# Patient Record
Sex: Male | Born: 1980 | Race: White | Hispanic: No | Marital: Married | Smoking: Never smoker
Health system: Southern US, Community
[De-identification: ages and names within clinical notes are randomized; demographics above are authoritative.]

## PROBLEM LIST (undated history)

## (undated) DIAGNOSIS — R5383 Other fatigue: Secondary | ICD-10-CM

## (undated) DIAGNOSIS — J342 Deviated nasal septum: Secondary | ICD-10-CM

## (undated) DIAGNOSIS — R002 Palpitations: Secondary | ICD-10-CM

## (undated) HISTORY — DX: Deviated nasal septum: J34.2

## (undated) HISTORY — DX: Other fatigue: R53.83

## (undated) HISTORY — DX: Palpitations: R00.2

## (undated) HISTORY — PX: TONSILLECTOMY: SUR1361

## (undated) HISTORY — PX: CLOSED REDUCTION / MANIPULATION JOINT: SUR207

---

## 2011-03-21 ENCOUNTER — Ambulatory Visit (HOSPITAL_COMMUNITY)
Admission: RE | Admit: 2011-03-21 | Discharge: 2011-03-21 | Disposition: A | Discharge status: Home / Self Care | Payer: Self-pay | Source: Ambulatory Visit | Attending: Chiropractic Medicine | Admitting: Chiropractic Medicine

## 2011-03-21 ENCOUNTER — Other Ambulatory Visit (HOSPITAL_COMMUNITY): Payer: Self-pay | Admitting: Chiropractic Medicine

## 2011-03-21 DIAGNOSIS — R52 Pain, unspecified: Secondary | ICD-10-CM

## 2011-03-21 DIAGNOSIS — M542 Cervicalgia: Secondary | ICD-10-CM | POA: Insufficient documentation

## 2011-03-21 DIAGNOSIS — M546 Pain in thoracic spine: Secondary | ICD-10-CM | POA: Insufficient documentation

## 2015-10-11 ENCOUNTER — Other Ambulatory Visit: Payer: Self-pay | Admitting: Otolaryngology

## 2015-10-11 NOTE — H&P (Signed)
PREOPERATIVE H&P  Chief Complaint: nasal obstruction and snoring  HPI: Mark Guerrero is a 34 y.o. male who presents for evaluation of nasal obstruction and snoring. He always has trouble breathing through his nose at night even when he uses flonase. He also snores at night according to his wife but doesn't stop breathing. Even when his nose is clearer he still snores some but not as bad. He has sepal deviation and turbinate hypertrophy. He's taken to the OR for septoplasty and TR.  No past medical history on file. No past surgical history on file. Social History   Social History  . Marital Status: Single    Spouse Name: N/A  . Number of Children: N/A  . Years of Education: N/A   Social History Main Topics  . Smoking status: Not on file  . Smokeless tobacco: Not on file  . Alcohol Use: Not on file  . Drug Use: Not on file  . Sexual Activity: Not on file   Other Topics Concern  . Not on file   Social History Narrative  . No narrative on file   No family history on file. Allergies not on file Prior to Admission medications   Not on File     Positive ROS: per hpi  All other systems have been reviewed and were otherwise negative with the exception of those mentioned in the HPI and as above.  Physical Exam: There were no vitals filed for this visit.  General: Alert, no acute distress Oral: Normal oral mucosa and s/p tonsillectomy as a child. Long thick uvula Nasal: Septal deviation to the left with large turbiates Neck: No palpable adenopathy or thyroid nodules Ear: Ear canal is clear with normal appearing TMs Cardiovascular: Regular rate and rhythm, no murmur.  Respiratory: Clear to auscultation Neurologic: Alert and oriented x 3   Assessment/Plan: SEPTAL DEVIATION, TURBINATE HYPERTROPHY Plan for Procedure(s): BILATERAL NASAL SEPTOPLASTY WITH TURBINATE REDUCTION   Dillard CannonNEWMAN, Nyeli Holtmeyer, MD 10/11/2015 3:46 PM

## 2015-10-12 ENCOUNTER — Encounter (HOSPITAL_BASED_OUTPATIENT_CLINIC_OR_DEPARTMENT_OTHER): Payer: Self-pay | Admitting: *Deleted

## 2015-10-18 ENCOUNTER — Ambulatory Visit (HOSPITAL_BASED_OUTPATIENT_CLINIC_OR_DEPARTMENT_OTHER): Admission: RE | Admit: 2015-10-18 | Payer: BLUE CROSS/BLUE SHIELD | Source: Ambulatory Visit | Admitting: Otolaryngology

## 2015-10-18 SURGERY — SEPTOPLASTY, NOSE, WITH NASAL TURBINATE REDUCTION
Anesthesia: General | Laterality: Bilateral

## 2018-10-01 ENCOUNTER — Ambulatory Visit: Payer: BLUE CROSS/BLUE SHIELD | Admitting: Family Medicine

## 2018-10-02 ENCOUNTER — Encounter (HOSPITAL_COMMUNITY): Payer: Self-pay | Admitting: Emergency Medicine

## 2018-10-02 ENCOUNTER — Emergency Department (HOSPITAL_COMMUNITY): Payer: 59

## 2018-10-02 ENCOUNTER — Emergency Department (HOSPITAL_COMMUNITY)
Admission: EM | Admit: 2018-10-02 | Discharge: 2018-10-03 | Disposition: A | Discharge status: Home / Self Care | Payer: 59 | Attending: Emergency Medicine | Admitting: Emergency Medicine

## 2018-10-02 ENCOUNTER — Other Ambulatory Visit: Payer: Self-pay

## 2018-10-02 DIAGNOSIS — Z79899 Other long term (current) drug therapy: Secondary | ICD-10-CM | POA: Insufficient documentation

## 2018-10-02 DIAGNOSIS — R5383 Other fatigue: Secondary | ICD-10-CM | POA: Diagnosis not present

## 2018-10-02 DIAGNOSIS — R531 Weakness: Secondary | ICD-10-CM | POA: Diagnosis present

## 2018-10-02 DIAGNOSIS — Z91010 Allergy to peanuts: Secondary | ICD-10-CM | POA: Diagnosis not present

## 2018-10-02 LAB — I-STAT TROPONIN, ED: Troponin i, poc: 0 ng/mL (ref 0.00–0.08)

## 2018-10-02 LAB — BASIC METABOLIC PANEL
ANION GAP: 9 (ref 5–15)
BUN: 12 mg/dL (ref 6–20)
CHLORIDE: 109 mmol/L (ref 98–111)
CO2: 23 mmol/L (ref 22–32)
Calcium: 9.5 mg/dL (ref 8.9–10.3)
Creatinine, Ser: 0.85 mg/dL (ref 0.61–1.24)
GFR calc Af Amer: >60 mL/min (ref >60)
GLUCOSE: 89 mg/dL (ref 70–99)
Potassium: 3.8 mmol/L (ref 3.5–5.1)
Sodium: 141 mmol/L (ref 135–145)

## 2018-10-02 LAB — CBC
HEMATOCRIT: 46.6 % (ref 39.0–52.0)
Hemoglobin: 15.6 g/dL (ref 13.0–17.0)
MCH: 30.3 pg (ref 26.0–34.0)
MCHC: 33.5 g/dL (ref 30.0–36.0)
MCV: 90.5 fL (ref 80.0–100.0)
NRBC: 0 % (ref 0.0–0.2)
Platelets: 252 10*3/uL (ref 150–400)
RBC: 5.15 MIL/uL (ref 4.22–5.81)
RDW: 11.5 % (ref 11.5–15.5)
WBC: 7.5 10*3/uL (ref 4.0–10.5)

## 2018-10-02 LAB — MAGNESIUM: MAGNESIUM: 2.2 mg/dL (ref 1.7–2.4)

## 2018-10-02 NOTE — ED Notes (Signed)
Patient transported to X-ray 

## 2018-10-02 NOTE — ED Triage Notes (Signed)
C/O of fatigue, SOB. Pt seen at urgent care 3 hours ago. Per pt, EKG showed irregular heart beat and was sent to ER.

## 2018-10-03 LAB — URINALYSIS, ROUTINE W REFLEX MICROSCOPIC
BILIRUBIN URINE: NEGATIVE
Glucose, UA: NEGATIVE mg/dL
Hgb urine dipstick: NEGATIVE
KETONES UR: NEGATIVE mg/dL
LEUKOCYTES UA: NEGATIVE
NITRITE: NEGATIVE
PROTEIN: NEGATIVE mg/dL
Specific Gravity, Urine: 1.005 (ref 1.005–1.030)
pH: 5 (ref 5.0–8.0)

## 2018-10-03 LAB — TSH: TSH: 4.016 u[IU]/mL (ref 0.350–4.500)

## 2018-10-03 NOTE — ED Notes (Signed)
Patient verbalizes understanding of discharge instructions. Opportunity for questioning and answers were provided. Armband removed by staff, pt discharged from ED ambulatory.   

## 2018-10-03 NOTE — ED Provider Notes (Signed)
[facility] Provider Note   CSN: [id number] Arrival date & time: 10/02/18  2244     History   Chief Complaint Chief Complaint  Patient presents with  . Weakness    HPI Mark Maxwellyler Tartt is a 37 y.o. male.  HPI 37 year old male with no significant past medical history here with fatigue.  The patient states that he has been under significantly increased stress recently at work.  He runs a small business.  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  He also has some occasional fullness sensation as well as a feeling of intermittent palpitations when he gets home.  He is exhausted by 9:00.  This is somewhat atypical for him.  Of note, his family did recently have a cold, with cough, but he has not been coughing.  He has tried sleeping and took a brief vacation this weekend, without any improvement.  He went to urgent care and was reportedly told he had calcifications on his chest x-ray and was told to come to the ER.  He subsequent presents for evaluation.  Denies any current chest pain or shortness of breath.  No personal family history of heart disease.  No history of thyroid disease.  No over-the-counter medications.  Denies any drug use.  No weight loss or night sweats.  History reviewed. No pertinent past medical history.  There are no active problems to display for this patient.   Past Surgical History:  Procedure Laterality Date  . CLOSED REDUCTION / MANIPULATION JOINT    . TONSILLECTOMY          Home Medications    Prior to Admission medications   Medication Sig Start Date End Date Taking? Authorizing Provider  fexofenadine (ALLEGRA) 30 MG tablet Take 30 mg by mouth 2 (two) times daily.    [provider]  fluticasone (FLONASE) 50 MCG/ACT nasal spray Place into both nostrils daily.    [provider]    Family  History History reviewed. No pertinent family history.  Social History Social History   Tobacco Use  . Smoking status: Never Smoker  . Smokeless tobacco: Never Used  Substance Use Topics  . Alcohol use: Yes    Comment: occas  . Drug use: No     Allergies   Peanut-containing drug products   Review of Systems Review of Systems  Constitutional: Positive for fatigue. Negative for chills and fever.  HENT: Negative for congestion and rhinorrhea.   Eyes: Negative for visual disturbance.  Respiratory: Negative for cough, shortness of breath and wheezing.   Cardiovascular: Positive for palpitations. Negative for chest pain and leg swelling.  Gastrointestinal: Negative for abdominal pain, diarrhea, nausea and vomiting.  Genitourinary: Negative for dysuria and flank pain.  Musculoskeletal: Negative for neck pain and neck stiffness.  Skin: Negative for rash and wound.  Allergic/Immunologic: Negative for immunocompromised state.  Neurological: Positive for weakness. Negative for syncope and headaches.  All other systems reviewed and are negative.    Physical Exam Updated Vital Signs BP 128/88 (BP Location: Right Arm)   Pulse 60   Temp 97.6 F (36.4 C) (Oral)   Resp 16   Ht 5\' 8"  (1.727 m)   Wt 67.1 kg   SpO2 100%   BMI 22.50 kg/m   Physical Exam  Constitutional: He is oriented to person, place, and time. He appears well-developed and well-nourished.  No distress.  HENT:  Head: Normocephalic and atraumatic.  Mouth/Throat: Oropharynx is clear and moist.  Eyes: Conjunctivae are normal.  Neck: Neck supple.  Cardiovascular: Normal rate, regular rhythm and normal heart sounds. Exam reveals no friction rub.  No murmur heard. Pulmonary/Chest: Effort normal and breath sounds normal. No respiratory distress. He has no wheezes. He has no rales.  Abdominal: He exhibits no distension.  Musculoskeletal: He exhibits no edema.  Neurological: He is alert and oriented to person, place,  and time. He exhibits normal muscle tone.  Skin: Skin is warm. Capillary refill takes less than 2 seconds.  Psychiatric: He has a normal mood and affect.  Nursing note and vitals reviewed.    ED Treatments / Results  Labs (all labs ordered are listed, but only abnormal results are displayed) Labs Reviewed  URINALYSIS, ROUTINE W REFLEX MICROSCOPIC - Abnormal; Notable for the following components:      Result Value   Color, Urine STRAW (*)    All other components within normal limits  BASIC METABOLIC PANEL  CBC  TSH  MAGNESIUM  I-STAT TROPONIN, ED    EKG EKG Interpretation  Date/Time:  Thursday October 02 2018 22:53:11 EST Ventricular Rate:  62 PR Interval:    QRS Duration: 101 QT Interval:  375 QTC Calculation: 381 R Axis:   56 Text Interpretation:  Sinus rhythm Low voltage, precordial leads Probable anteroseptal infarct, old No old tracing to compare Confirmed by Shaune Pollack [phone]) on 10/02/2018 11:07:09 PM   Radiology Dg Chest 2 View  Result Date: 10/02/2018 CLINICAL DATA:  Irregular heartbeat.  Shortness of breath. EXAM: CHEST - 2 VIEW COMPARISON:  None. FINDINGS: The heart size and mediastinal contours are within normal limits. Both lungs are clear. The visualized skeletal structures are unremarkable. IMPRESSION: No active cardiopulmonary disease. Electronically Signed   By: Gerome Sam III M.D   On: 10/02/2018 23:52    Procedures Procedures (including critical care time)  Medications Ordered in ED Medications - No data to display   Initial Impression / Assessment and Plan / ED Course  I have reviewed the triage vital signs and the nursing notes.  Pertinent labs & imaging results that were available during my care of the patient were reviewed by me and considered in my medical decision making (see chart for details).     37 yo M here with subjective weakness. Labs today are very reassuring. No anemia, lyte abnormality. His EKG shows sinus  bradycardia and I suspect this is physiological. I reviewed his old FitBit data and his resting HR is normally 50s. CXR here is normal. Doubt any significant underlying emergent pathology. TSH normal. Suspect this could be work-related fatigue. However,he does report h/o intermittent palpitations, with some SOB with it so will refer for possible outpt holter. No signs of WPW, long QT or other abnormality on EKG. D/c home.  Final Clinical Impressions(s) / ED Diagnoses   Final diagnoses:  Weakness  Fatigue, unspecified type    ED Discharge Orders    None       Shaune Pollack, MD 10/03/18 0110

## 2018-10-03 NOTE — ED Notes (Signed)
Stands on own ability for ortho VS. No light-headed feelings. Steady on feet.

## 2018-10-03 NOTE — Discharge Instructions (Addendum)
If you notice your heart rate consistently dropping below 45-50 with symptoms of lightheadedness, and this persists for more than 5 minutes, return to the ER  Drink plenty of fluids  Try to get at least 8 hours of sleep nightly  Avoid caffeine intake later in the evening  Follow-up with Cardiologist as above

## 2018-10-15 NOTE — Progress Notes (Signed)
Cardiology Office Note   Date:  10/17/2018   ID:  Mark Guerrero, DOB [birth year], MRN [id number]  PCP:  Norval Gable, DO  Cardiologist:   No primary care provider on file. Referring:  Shaune Pollack, MD  Chief Complaint  Patient presents with  . Palpitations      History of Present Illness: Mark Guerrero is a 37 y.o. male who presents for palpitations and weakness.  He was I the ED late last month for this.  There were no significant findings.  He did have a low HR on his FitBit but no other significant abnormalities.  I reviewed these records for this visit.   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  He has stress with this.  He says he wakes up is not rested.  He has a deviated septum and was told that he snores.  He was describing some symptoms of feeling like his heart was racing but it really was not.  He wears a Fitbit and he would notice that his heart would go up but it would go very low as well particularly when he was asleep.  Because his heart rate was in the 40s he was sent to the emergency room.  I reviewed these records for this visit.  There were no significant abnormalities.  He is quite active.  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  But he still does occasionally and it hurts his back but it does not cause chest pain, chest pressure, neck or arm discomfort.  Even when he feels like he has some rapid heart rate is not describing skipped heartbeat and his heart rate on his Fitbit may be normal and he feels like he might be high.  He wonders if some of this could be anxiety.  He is never been managed or treated for this.   Past Medical History:  Diagnosis Date  . Deviated septum   . Fatigue   . Palpitation     Past Surgical History:  Procedure Laterality Date  . CLOSED REDUCTION / MANIPULATION JOINT    . TONSILLECTOMY       Current Outpatient Medications  Medication  Sig Dispense Refill  . fexofenadine (ALLEGRA) 30 MG tablet Take 30 mg by mouth 2 (two) times daily.    . fluticasone (FLONASE) 50 MCG/ACT nasal spray Place into both nostrils daily.     No current facility-administered medications for this visit.     Allergies:   Peanut-containing drug products    Social History:  The patient  reports that he has never smoked. He has never used smokeless tobacco. He reports that he drinks alcohol. He reports that he does not use drugs.   Family History:  The patient's family history includes Hyperlipidemia in his father and mother; Hypertension in his father and mother.    ROS:  Please see the history of present illness.   Otherwise, review of systems are positive for none.   All other systems are reviewed and negative.    PHYSICAL EXAM: VS:  BP 121/78 (BP Location: Left Arm, Patient Position: Sitting, Cuff Size: Normal)   Pulse 72   Ht 5\' 8"  (1.727 m)   Wt 147 lb (66.7 kg)   BMI 22.35 kg/m  , BMI Body mass index is 22.35 kg/m. GENERAL:  Well appearing HEENT:  Pupils equal round and reactive, fundi not visualized, oral  mucosa unremarkable NECK:  No jugular venous distention, waveform within normal limits, carotid upstroke brisk and symmetric, no bruits, no thyromegaly LYMPHATICS:  No cervical, inguinal adenopathy LUNGS:  Clear to auscultation bilaterally BACK:  No CVA tenderness CHEST:  Unremarkable HEART:  PMI not displaced or sustained,S1 and S2 within normal limits, no S3, no S4, no clicks, no rubs, no murmurs ABD:  Flat, positive bowel sounds normal in frequency in pitch, no bruits, no rebound, no guarding, no midline pulsatile mass, no hepatomegaly, no splenomegaly EXT:  2 plus pulses throughout, no edema, no cyanosis no clubbing SKIN:  No rashes no nodules NEURO:  Cranial nerves II through XII grossly intact, motor grossly intact throughout PSYCH:  Cognitively intact, oriented to person place and time    EKG:  EKG is not ordered  today. The ekg ordered today demonstrates sinus rhythm, rate 62, axis within normal limits, intervals within normal limits, no acute ST-T wave changes.   Recent Labs: 10/02/2018: BUN 12; Creatinine, Ser 0.85; Hemoglobin 15.6; Magnesium 2.2; Platelets 252; Potassium 3.8; Sodium 141; TSH 4.016    Lipid Panel No results found for: CHOL, TRIG, HDL, CHOLHDL, VLDL, LDLCALC, LDLDIRECT    Wt Readings from Last 3 Encounters:  10/17/18 147 lb (66.7 kg)  10/02/18 148 lb (67.1 kg)      Other studies Reviewed: Additional studies/ records that were reviewed today include: ED records. Review of the above records demonstrates:  Please see elsewhere in the note.     ASSESSMENT AND PLAN:  PALPITATIONS: I think his tachycardia sensation is not actually tachycardia.  He had bradycardia but probably because of his increased level of fitness.  His nighttime bradycardia would not be an issue.  I reviewed his Fitbit and he had normal heart rate response with activity.  No further evaluation is warranted.  Of note I did review his blood work and his TSH was normal.  Electrolytes were unremarkable.  WEAKNESS: This might be related to get more sleep.  He has a deviated septum and is been seeing ENT I think is reasonable to continue that route or to consider sleep apnea which would more likely be central since he does not have an obstructive type physiology.  He will look into this and follow-up with his primary provider and his ENT.  CHEST PAIN: I think the pretest probability of obstructive coronary disease is extremely low.  I think this may well be stress related.  We talked a long time about this.  I would advise exercise.  We talked about relaxation and perspective.  No further cardiac work-up is suggested.    Current medicines are reviewed at length with the patient today.  The patient does not have concerns regarding medicines.  The following changes have been made:  no change  Labs/ tests ordered  today include: None No orders of the defined types were placed in this encounter.    Disposition:   FU with me as needed.      Signed, Rollene RotundaJames Xeng Kucher, MD  10/17/2018 10:19 AM    [facility]

## 2018-10-17 ENCOUNTER — Ambulatory Visit: Payer: 59 | Admitting: Cardiology

## 2018-10-17 ENCOUNTER — Encounter: Payer: Self-pay | Admitting: Cardiology

## 2018-10-17 VITALS — BP 121/78 | HR 72 | Ht 68.0 in | Wt 147.0 lb

## 2018-10-17 DIAGNOSIS — R079 Chest pain, unspecified: Secondary | ICD-10-CM | POA: Diagnosis not present

## 2018-10-17 DIAGNOSIS — R002 Palpitations: Secondary | ICD-10-CM | POA: Diagnosis not present

## 2018-10-17 NOTE — Patient Instructions (Signed)

## 2018-10-27 NOTE — Progress Notes (Signed)
[facility] [address] [place], [place] [address] Phone: [phone] Subjective:    I Ronelle Nigh am serving as a Neurosurgeon for Dr. Antoine Primas.    CC: Back pain follow-up  GLO:[id number]  Aceson Pitzer is a 37 y.o. male coming in with complaint of back pain. 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 Has xrays on his phone. Posture is an issue. No numbness and tingling.    Location- Chronic Character- sharp Aggravating factors- sitting for too long Reliving factors- Ice, heat Therapies tried-  Severity-intensity 10   Patient has x-rays from 2012.  X-ray showed the patient did have anterior wedging of the thoracic spine in 3 different levels.  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  Past Medical History:  Diagnosis Date  . Deviated septum   . Fatigue   . Palpitation    Past Surgical History:  Procedure Laterality Date  . CLOSED REDUCTION / MANIPULATION JOINT    . TONSILLECTOMY     Social History   Socioeconomic History  . Marital status: Married    Spouse name: Not on file  . Number of children: Not on file  . Years of education: Not on file  . Highest education level: Not on file  Occupational History  . Not on file  Social Needs  . Financial resource strain: Not on file  . Food insecurity:    Worry: Not on file    Inability: Not on file  . Transportation needs:    Medical: Not on file    Non-medical: Not on file  Tobacco Use  . Smoking status: Never Smoker  . Smokeless tobacco: Never Used  Substance and Sexual Activity  . Alcohol use: Yes    Comment: occas  . Drug use: No  . Sexual activity: Not on file    Comment: MARRIED  Lifestyle  . Physical activity:    Days per week: Not on file    Minutes per session: Not on file  . Stress: Not on file  Relationships  . Social connections:    Talks on phone: Not on file    Gets together: Not on file    Attends religious service: Not on file   Active member of club or organization: Not on file    Attends meetings of clubs or organizations: Not on file    Relationship status: Not on file  Other Topics Concern  . Not on file  Social History Narrative  . Not on file   Allergies  Allergen Reactions  . Peanut-Containing Drug Products    Family History  Problem Relation Age of Onset  . Hyperlipidemia Mother   . Hypertension Mother   . Hyperlipidemia Father   . Hypertension Father       Current Outpatient Medications (Respiratory):  .  fexofenadine (ALLEGRA) 30 MG tablet, Take 30 mg by mouth 2 (two) times daily. .  fluticasone (FLONASE) 50 MCG/ACT nasal spray, Place into both nostrils daily.       Past medical history, social, surgical and family history all reviewed in electronic medical record.  No pertanent information unless stated regarding to the chief complaint.   Review of Systems:  No headache, visual changes, nausea, vomiting, diarrhea, constipation, dizziness, abdominal pain, skin rash, fevers, chills, night sweats, weight loss, swollen lymph nodes, body aches, joint swelling, muscle aches, chest pain, shortness of breath, mood changes.   Objective  There were no vitals taken for this visit. Systems examined below  as of    General: No apparent distress alert and oriented x3 mood and affect normal, dressed appropriately.  HEENT: Pupils equal, extraocular movements intact  Respiratory: Patient's speak in full sentences and does not appear short of breath  Cardiovascular: No lower extremity edema, non tender, no erythema  Skin: Warm dry intact with no signs of infection or rash on extremities or on axial skeleton.  Abdomen: Soft nontender  Neuro: Cranial nerves II through XII are intact, neurovascularly intact in all extremities with 2+ DTRs and 2+ pulses.  Lymph: No lymphadenopathy of posterior or anterior cervical chain or axillae bilaterally.  Gait normal with good balance and coordination.  MSK:  Non  tender with full range of motion and good stability and symmetric strength and tone of shoulders, elbows, wrist, hip, knee and ankles bilaterally.  Back Exam:  Inspection: Unremarkable  Motion: Flexion 45 deg, Extension 45 deg, Side Bending to 45 deg bilaterally,  Rotation to 45 deg bilaterally  SLR laying: Negative  XSLR laying: Negative  Palpable tenderness: Tender to palpation in the paraspinal musculature mostly in the thoracolumbar juncture. FABER: negative. Sensory change: Gross sensation intact to all lumbar and sacral dermatomes.  Reflexes: 2+ at both patellar tendons, 2+ at achilles tendons, Babinski's downgoing.  Strength at foot  Plantar-flexion: 5/5 Dorsi-flexion: 5/5 Eversion: 5/5 Inversion: 5/5  Leg strength  Quad: 5/5 Hamstring: 5/5 Hip flexor: 5/5 Hip abductors: 5/5  Gait unremarkable.  [id number]; 15 additional minutes spent for Therapeutic exercises as stated in above notes.  This included exercises focusing on stretching, strengthening, with significant focus on eccentric aspects.   Long term goals include an improvement in range of motion, strength, endurance as well as avoiding reinjury. Patient's frequency would include in 1-2 times a day, 3-5 times a week for a duration of 6-12 weeks. Low back exercises that included:  Pelvic tilt/bracing instruction to focus on control of the pelvic girdle and lower abdominal muscles  Glute strengthening exercises, focusing on proper firing of the glutes without engaging the low back muscles Proper stretching techniques for maximum relief for the hamstrings, hip flexors, low back and some rotation where tolerated   Proper technique shown and discussed handout in great detail with ATC.  All questions were discussed and answered.     Impression and Recommendations:     This case required medical decision making of moderate complexity. The above documentation has been reviewed and is accurate and complete Judi SaaZachary M Sharmaine Bain, DO        Note: This dictation was prepared with Dragon dictation along with smaller phrase technology. Any transcriptional errors that result from this process are unintentional.

## 2018-10-28 ENCOUNTER — Ambulatory Visit (INDEPENDENT_AMBULATORY_CARE_PROVIDER_SITE_OTHER)
Admission: RE | Admit: 2018-10-28 | Discharge: 2018-10-28 | Disposition: A | Discharge status: Home / Self Care | Payer: 59 | Source: Ambulatory Visit | Attending: Family Medicine | Admitting: Family Medicine

## 2018-10-28 ENCOUNTER — Ambulatory Visit: Payer: 59 | Admitting: Family Medicine

## 2018-10-28 ENCOUNTER — Encounter: Payer: Self-pay | Admitting: Family Medicine

## 2018-10-28 VITALS — BP 130/74 | HR 70 | Ht 68.0 in | Wt 149.0 lb

## 2018-10-28 DIAGNOSIS — M546 Pain in thoracic spine: Secondary | ICD-10-CM | POA: Diagnosis not present

## 2018-10-28 DIAGNOSIS — G8929 Other chronic pain: Secondary | ICD-10-CM

## 2018-10-28 DIAGNOSIS — M549 Dorsalgia, unspecified: Secondary | ICD-10-CM

## 2018-10-28 MED ORDER — VITAMIN D (ERGOCALCIFEROL) 1.25 MG (50000 UNIT) PO CAPS: 50000.0000 [IU] | ORAL_CAPSULE | ORAL | 0 refills | Status: AC

## 2018-10-28 NOTE — Assessment & Plan Note (Signed)
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  Concern with patient previous imaging for the possibility of Scheuermann's disease.  Neck could be possibly compression fractures however never followed up.  Patient has tried chiropractic care for some time but did not feel that would be beneficial until we know more.  Possible laboratory work-up will be considered at follow-up if continuing to have trouble.  Started once weekly vitamin D.  Home exercises given and work with Event organiserathletic trainer.  Discussed icing regimen.  Discussed over-the-counter medications.  Follow-up again 4 to 6 weeks

## 2018-10-28 NOTE — Patient Instructions (Signed)
Good to see you.  Ice 20 minutes 2 times daily. Usually after activity and before bed. Exercises 3 times a week.  Adjustable standing desk would be great  Continue your other therapies we discussed Read a little of scheurman's disease. Once weekly vitamin D for 12 weeks Over the counter get  Turmeric 500mg  daily  Tart cherry extract any dos eat night See me again in in 5-7 weeks Happy holidays!

## 2018-12-08 NOTE — Progress Notes (Signed)
[facility] [address] [place], [place] [address] Phone: [phone] Subjective:   [name], am serving as a scribe for Dr. Antoine Primas.   CC: Back pain follow-up  HCW:[id number]  Mark Guerrero is a 38 y.o. male coming in with complaint of back pain. Does have less pain since last visit. Has had one session of Rolfing since last visit. Has not been using standing desk as he rarely sits down at work.  Did have x-rays at last follow-up.  Very minimal Schmorl nodes noted.  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  Feels like he is doing approximately 50% better.  Taking the vitamin supplementations that were recommended     Past Medical History:  Diagnosis Date  . Deviated septum   . Fatigue   . Palpitation    Past Surgical History:  Procedure Laterality Date  . CLOSED REDUCTION / MANIPULATION JOINT    . TONSILLECTOMY     Social History   Socioeconomic History  . Marital status: Married    Spouse name: Not on file  . Number of children: Not on file  . Years of education: Not on file  . Highest education level: Not on file  Occupational History  . Not on file  Social Needs  . Financial resource strain: Not on file  . Food insecurity:    Worry: Not on file    Inability: Not on file  . Transportation needs:    Medical: Not on file    Non-medical: Not on file  Tobacco Use  . Smoking status: Never Smoker  . Smokeless tobacco: Never Used  Substance and Sexual Activity  . Alcohol use: Yes    Comment: occas  . Drug use: No  . Sexual activity: Not on file    Comment: MARRIED  Lifestyle  . Physical activity:    Days per week: Not on file    Minutes per session: Not on file  . Stress: Not on file  Relationships  . Social connections:    Talks on phone: Not on file    Gets together: Not on file    Attends religious service: Not on file    Active member of club or organization: Not on file    Attends meetings  of clubs or organizations: Not on file    Relationship status: Not on file  Other Topics Concern  . Not on file  Social History Narrative  . Not on file   Allergies  Allergen Reactions  . Peanut-Containing Drug Products    Family History  Problem Relation Age of Onset  . Hyperlipidemia Mother   . Hypertension Mother   . Hyperlipidemia Father   . Hypertension Father       Current Outpatient Medications (Respiratory):  .  fexofenadine (ALLEGRA) 30 MG tablet, Take 30 mg by mouth 2 (two) times daily. .  fluticasone (FLONASE) 50 MCG/ACT nasal spray, Place into both nostrils daily.    Current Outpatient Medications (Other):  Marland Kitchen  Vitamin D, Ergocalciferol, (DRISDOL) 1.25 MG (50000 UT) CAPS capsule, Take 1 capsule (50,000 Units total) by mouth every 7 (seven) days. Marland Kitchen  amoxicillin-clavulanate (AUGMENTIN) 875-125 MG tablet, Take 1 tablet by mouth 2 (two) times daily for 10 days.    Past medical history, social, surgical and family history all reviewed in electronic medical record.  No pertanent information unless stated regarding to the chief complaint.   Review of Systems:  No headache, visual  changes, nausea, vomiting, diarrhea, constipation, dizziness, abdominal pain, skin rash, fevers, chills, night sweats, weight loss, swollen lymph nodes, body aches, joint swelling, muscle aches, chest pain, shortness of breath, mood changes.   Objective  Blood pressure 108/76, pulse 85, height 5\' 8"  (1.727 m), weight 144 lb (65.3 kg), SpO2 98 %.    General: No apparent distress alert and oriented x3 mood and affect normal, dressed appropriately.  HEENT: Pupils equal, extraocular movements intact  Respiratory: Patient's speak in full sentences and does not appear short of breath  Cardiovascular: No lower extremity edema, non tender, no erythema  Skin: Warm dry intact with no signs of infection or rash on extremities or on axial skeleton.  Abdomen: Soft nontender  Neuro: Cranial nerves II  through XII are intact, neurovascularly intact in all extremities with 2+ DTRs and 2+ pulses.  Lymph: No lymphadenopathy of posterior or anterior cervical chain or axillae bilaterally.  Gait normal with good balance and coordination.  MSK:  Non tender with full range of motion and good stability and symmetric strength and tone of shoulders, elbows, wrist, hip, knee and ankles bilaterally.  Back Exam:  Inspection: Very mild increase in kyphosis of the upper thoracic but otherwise fairly unremarkable Motion: Flexion 45 deg, Extension 45 deg, Side Bending to 45 deg bilaterally,  Rotation to 45 deg bilaterally  SLR laying: Negative  XSLR laying: Negative  Palpable tenderness: Tightness still noted in the thoracolumbar junction right greater than left.Marland Kitchen FABER: negative. Sensory change: Gross sensation intact to all lumbar and sacral dermatomes.  Reflexes: 2+ at both patellar tendons, 2+ at achilles tendons, Babinski's downgoing.  Strength at foot  Plantar-flexion: 5/5 Dorsi-flexion: 5/5 Eversion: 5/5 Inversion: 5/5  Leg strength  Quad: 5/5 Hamstring: 5/5 Hip flexor: 5/5 Hip abductors: 5/5  Gait unremarkable.   Impression and Recommendations:    . The above documentation has been reviewed and is accurate and complete [name], DO       Note: This dictation was prepared with Dragon dictation along with smaller phrase technology. Any transcriptional errors that result from this process are unintentional.

## 2018-12-09 ENCOUNTER — Ambulatory Visit: Payer: 59 | Admitting: Family Medicine

## 2018-12-09 ENCOUNTER — Encounter: Payer: Self-pay | Admitting: Family Medicine

## 2018-12-09 DIAGNOSIS — G8929 Other chronic pain: Secondary | ICD-10-CM

## 2018-12-09 DIAGNOSIS — M546 Pain in thoracic spine: Secondary | ICD-10-CM | POA: Diagnosis not present

## 2018-12-09 MED ORDER — AMOXICILLIN-POT CLAVULANATE 875-125 MG PO TABS: 1.0000 | ORAL_TABLET | Freq: Two times a day (BID) | ORAL | 0 refills | Status: AC

## 2018-12-09 NOTE — Assessment & Plan Note (Signed)
Patient's x-ray showed some mild Schmorl nodes.  Patient is made of 50 to 60% improvement with just with conservative therapy.  Encourage patient to increase activity slowly over the course of next several weeks.  Discussed icing regimen and home exercise.  Discussed the possibility of formal physical therapy.  As long as patient is well follow-up again in 2 to 3 months.

## 2018-12-09 NOTE — Patient Instructions (Signed)
Good to see you  Ice is your friend Try to make it a routine and stay active Keep working on the core strength  Augmentin 2 times a day for 10 days and start in 2 days if not better See me again in 2 months

## 2019-07-12 IMAGING — DX DG LUMBAR SPINE COMPLETE 4+V
5 series · 5 of 5 positions shown · non-contrast
Comparison: None.

CLINICAL DATA: 37-year-old male with chronic mid and lower back
pain for 3-5 years. No known injury. Initial encounter.

EXAM:
LUMBAR SPINE - COMPLETE 4+ VIEW

[l-spine ap]
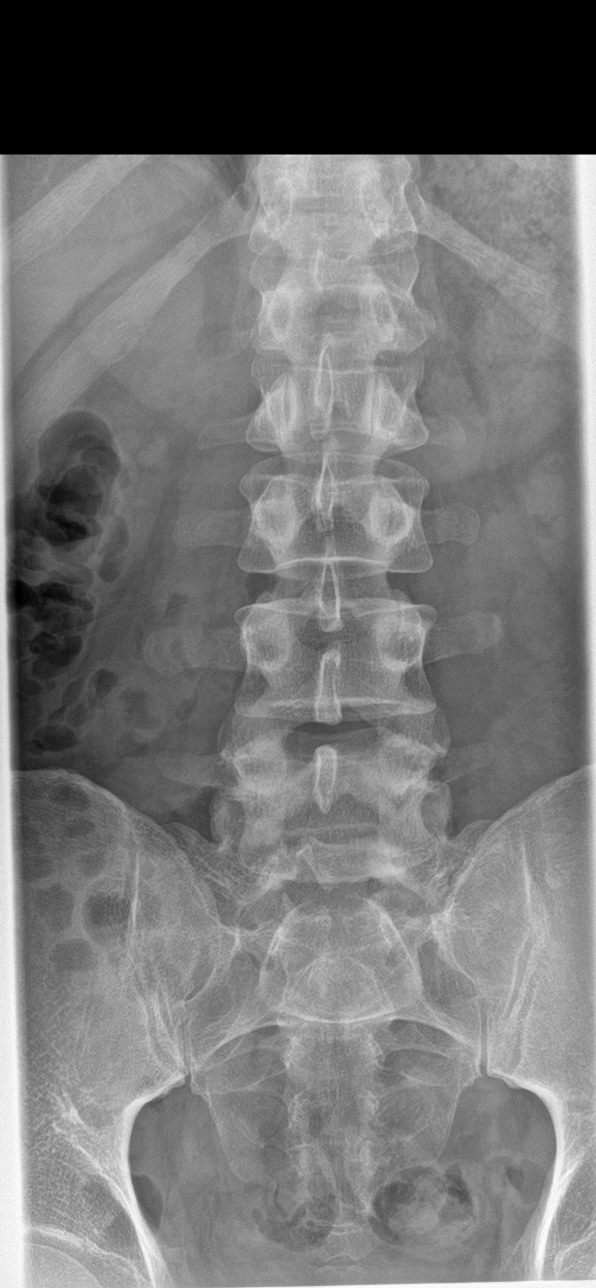

[l-spine obl (1 of 2)]
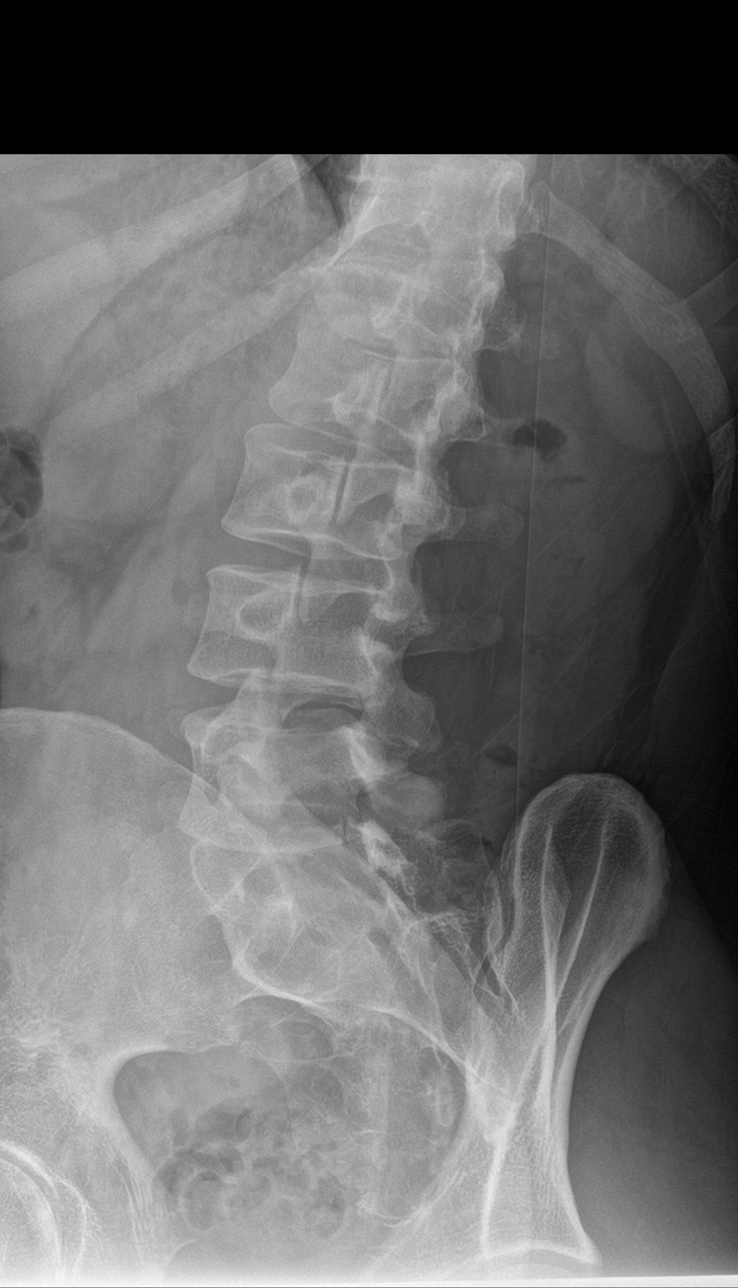

[l-spine obl (2 of 2)]
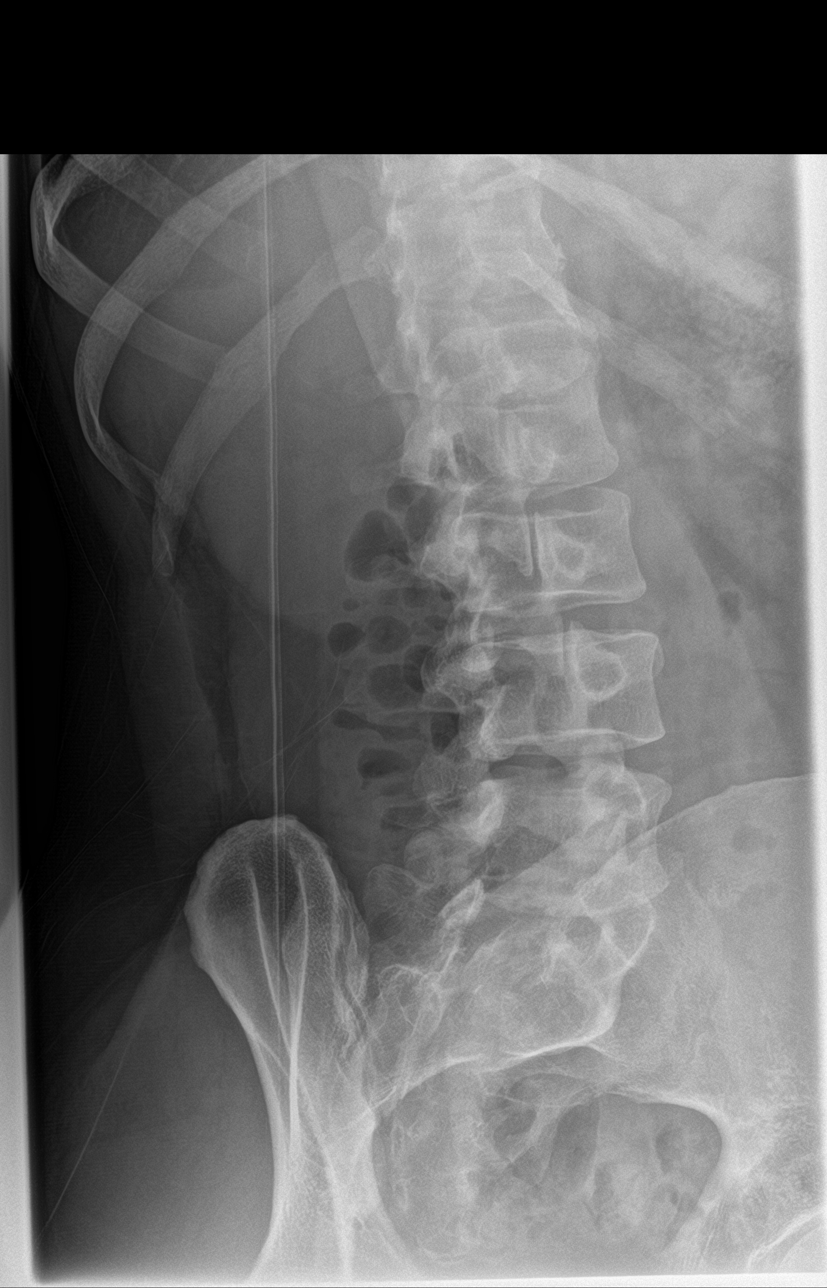

[l-spine lat]
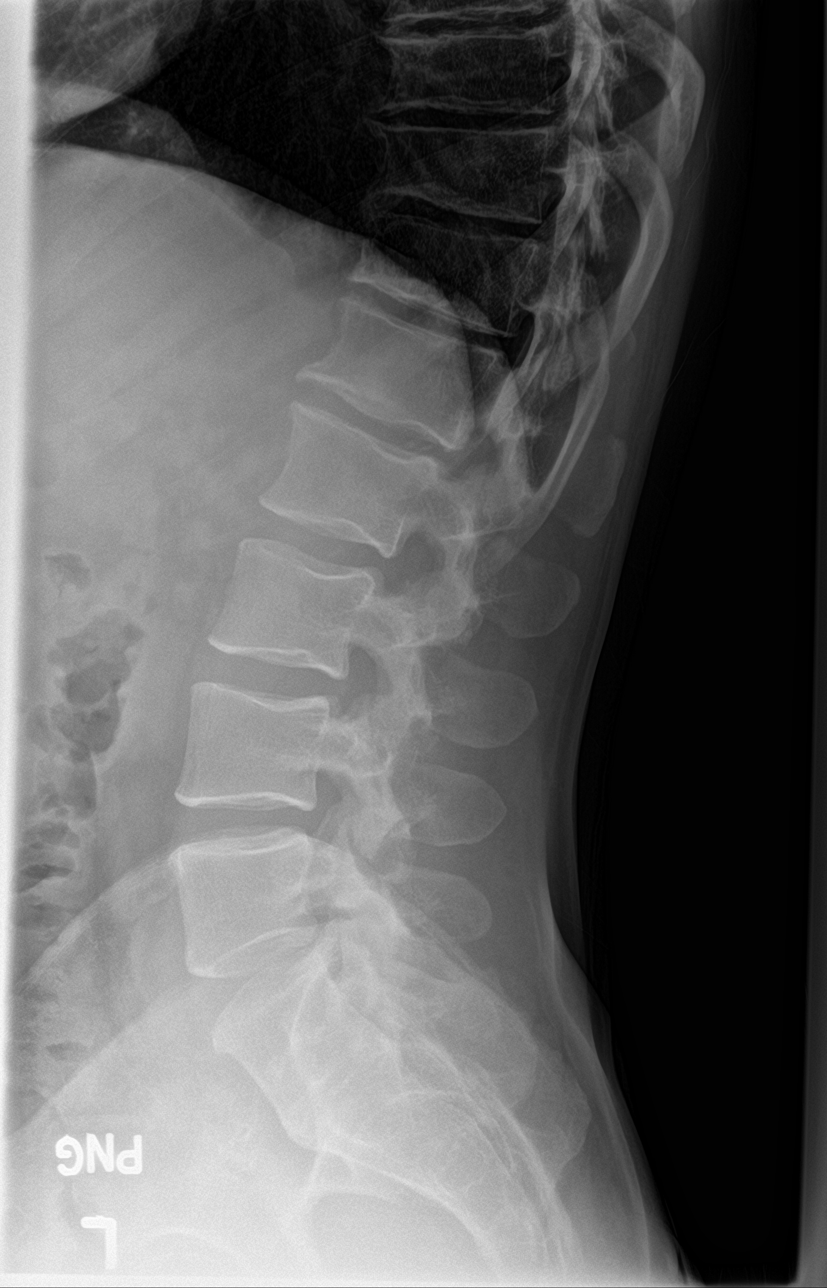

[l-spine spot]
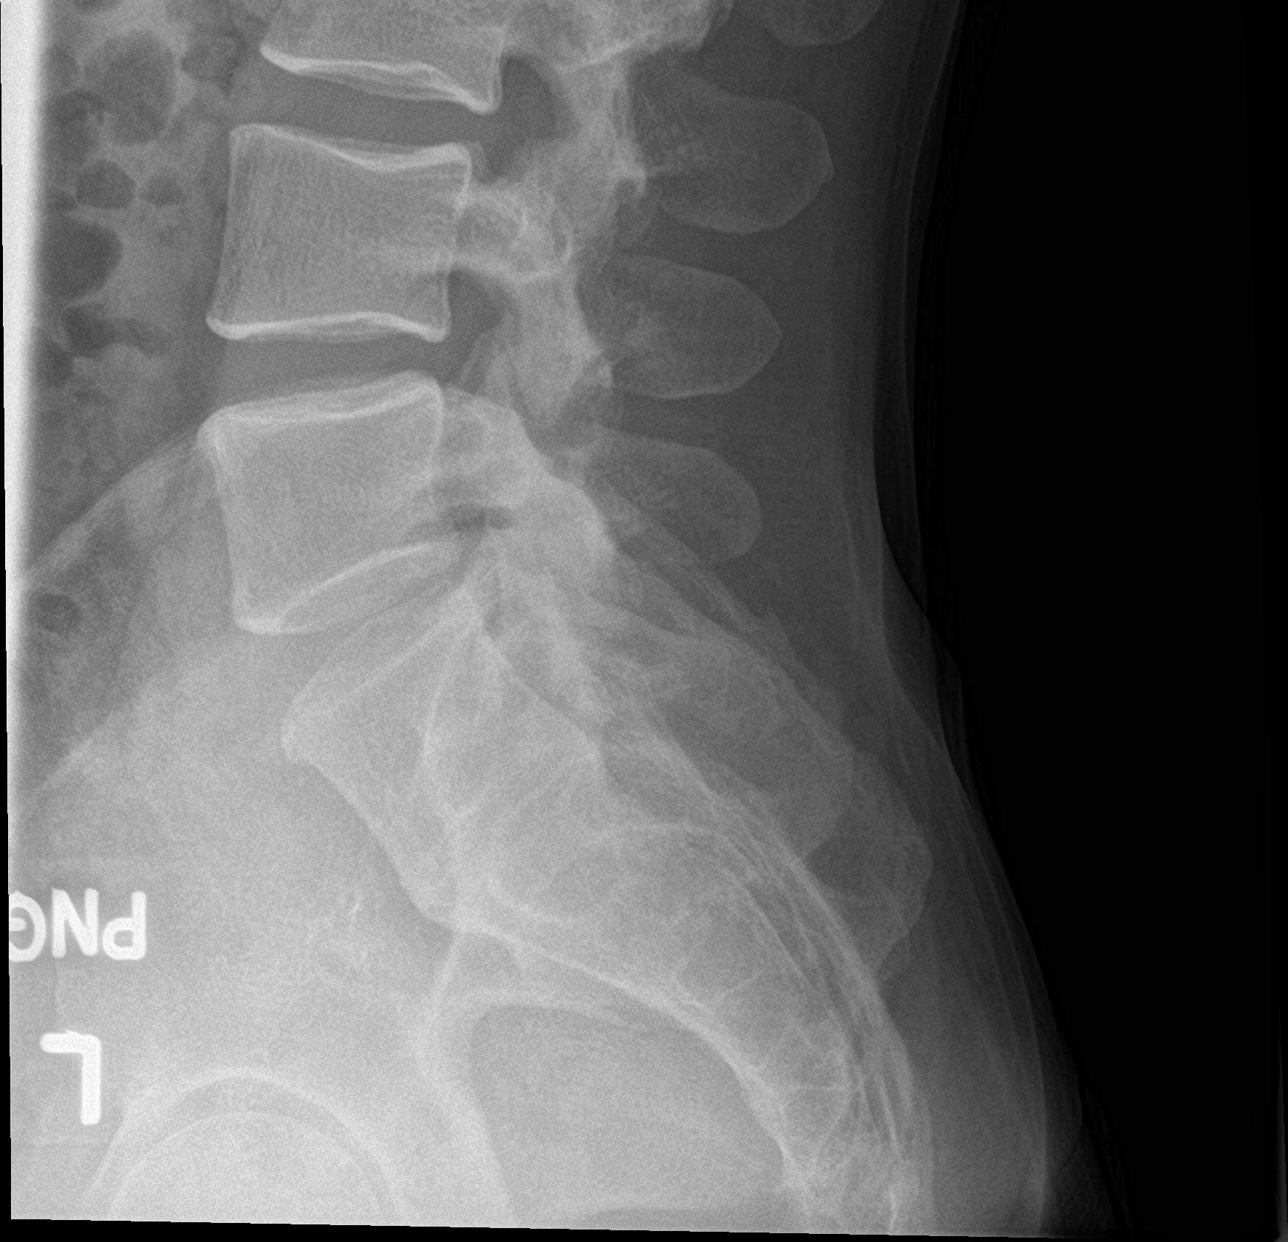

[5 of 5 positions shown; findings below may reference images not displayed]

FINDINGS: Transitional S1 vertebra. Normal alignment lumbar spine. No
compression fracture or pars defect.

L1-2 mild disc space narrowing with small Schmorl's node deformity
and anterior osteophyte.

L2-3 small Schmorl's node deformity.

L3-4 small Schmorl's node deformity.

L4-5 and L5-S1 without significant disc space narrowing.
IMPRESSION: 1. L1-2 mild disc space narrowing with small Schmorl's node
deformity and anterior osteophyte.
2. L2-3 and L3-4 small Schmorl's node deformity.

## 2019-07-12 IMAGING — DX DG THORACIC SPINE 3V
3 series · 3 of 3 positions shown · non-contrast
Comparison: Lumbar spine films same date dictated separately.
10/02/2018 chest x-ray.

CLINICAL DATA: 37-year-old male with chronic mid to lower back pain
for 3-5 years. No known injury.

EXAM:
THORACIC SPINE - 3 VIEWS

[t-spine ap]
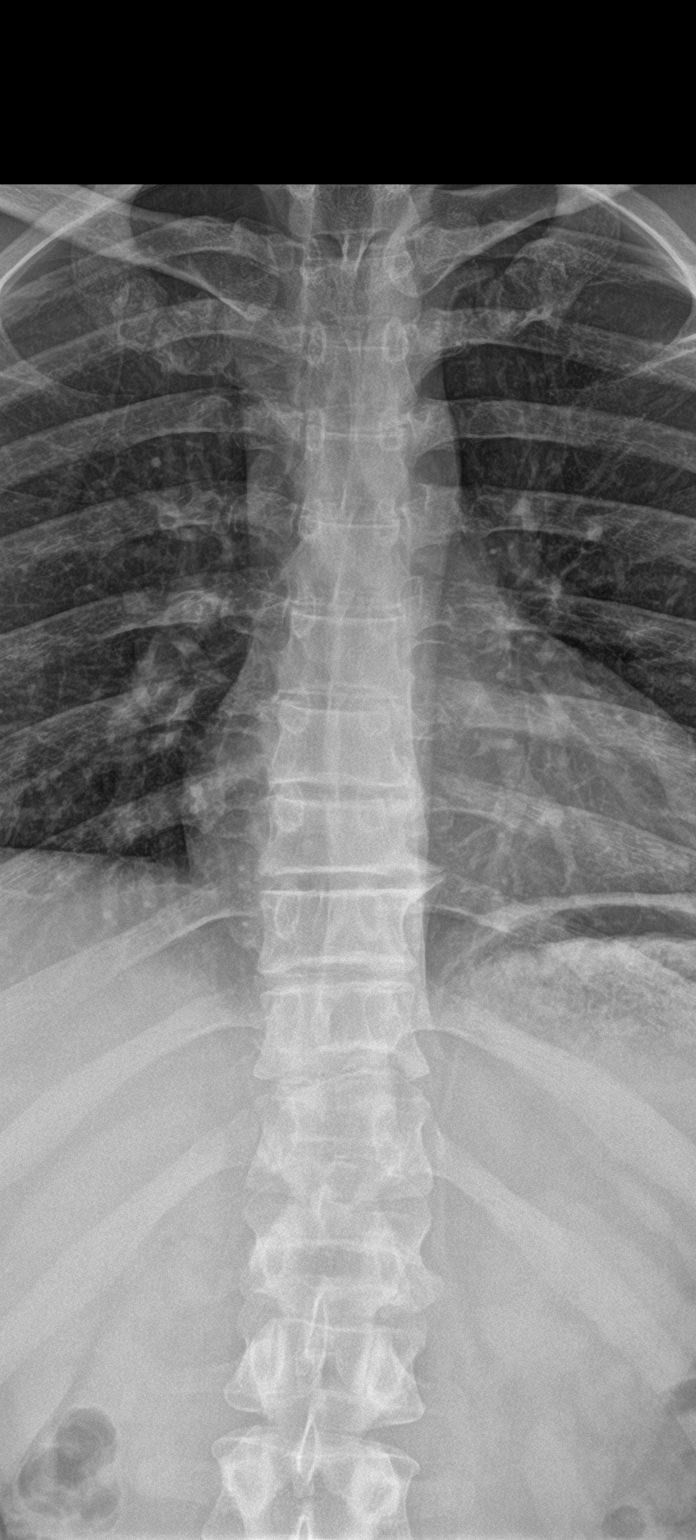

[t-spine lat]
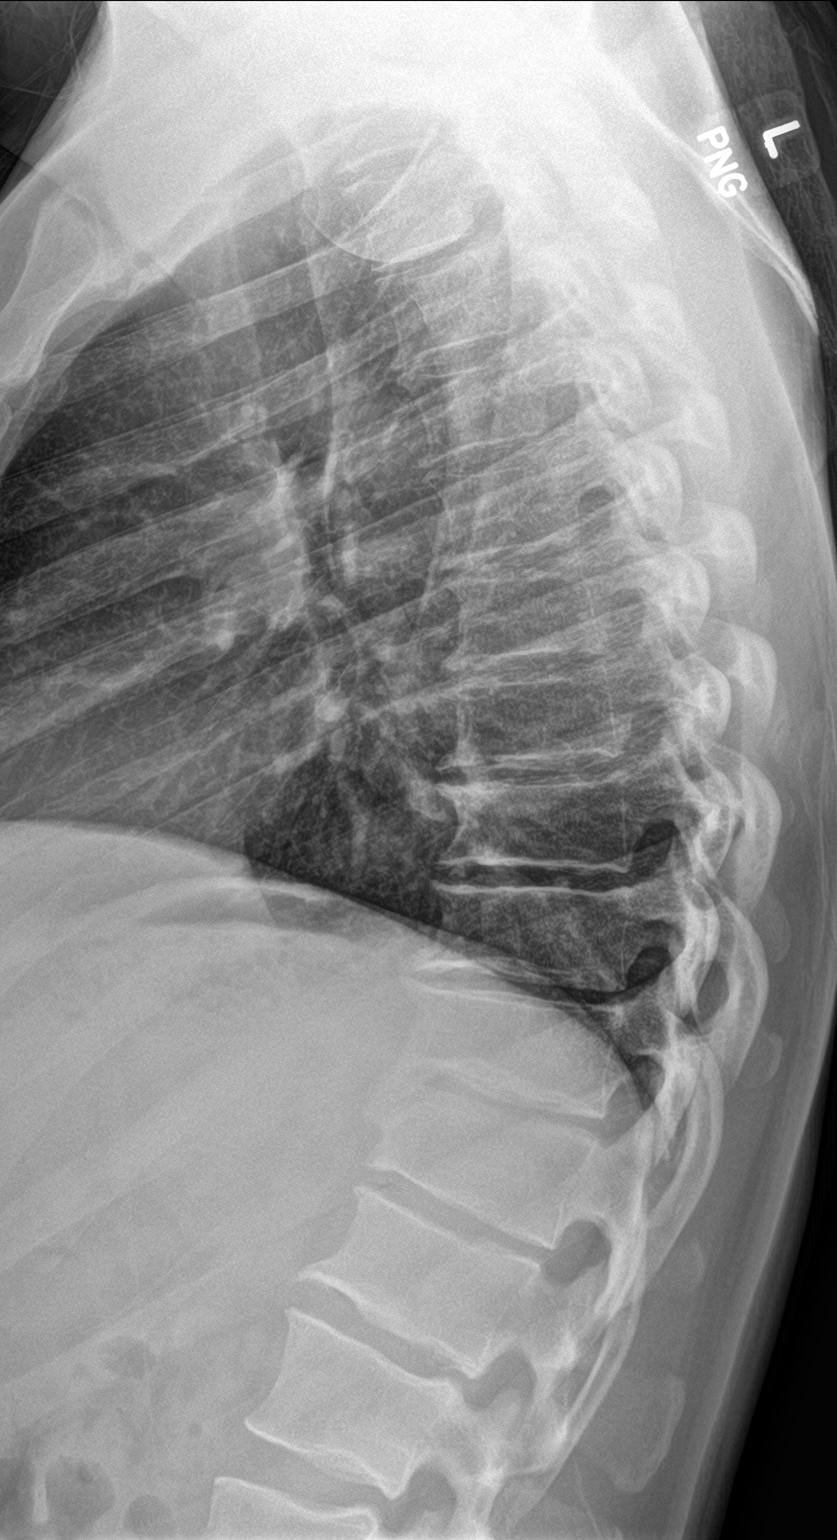

[swimmer]
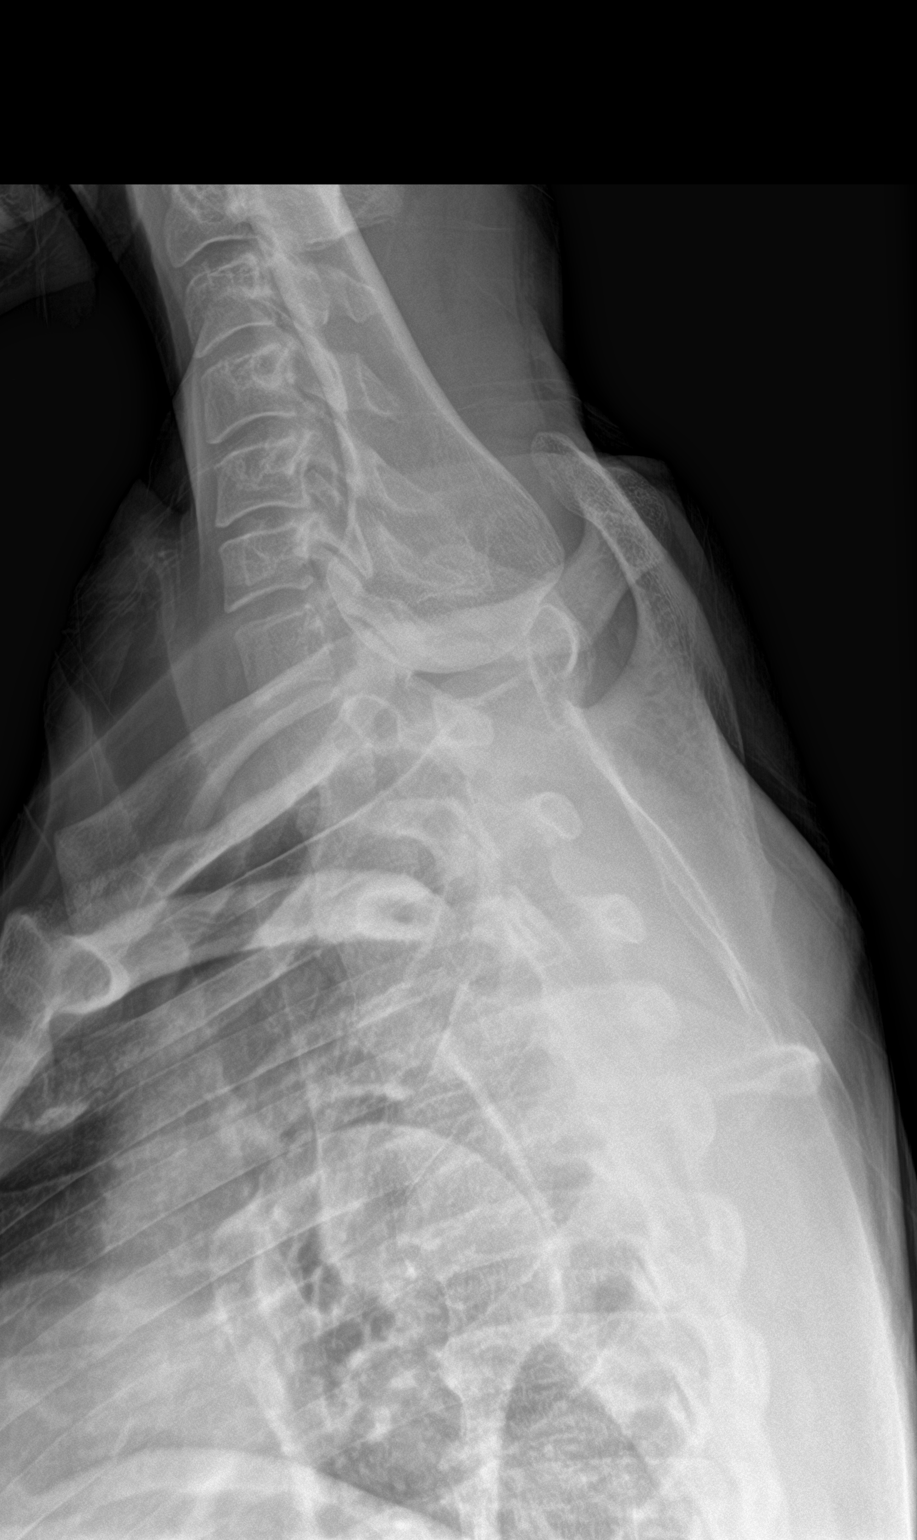

[3 of 3 positions shown; findings below may reference images not displayed]

FINDINGS: Very mild thoracic kyphosis.

Mild loss of height with increased AP dimension T9 through T12
vertebra. No focal compression fracture.

Mild disc space narrowing with anterior osteophyte T7-8 through
L1-2.
IMPRESSION: 1. Mild loss of height with increased AP dimension T9 through T12
vertebra.
2. Mild disc space narrowing with anterior osteophyte T7-8 through
L1-2.
